# Patient Record
Sex: Male | Born: 1970 | Hispanic: No | Smoking: Never smoker
Health system: Southern US, Community
[De-identification: ages and names within clinical notes are randomized; demographics above are authoritative.]

## PROBLEM LIST (undated history)

## (undated) DIAGNOSIS — E119 Type 2 diabetes mellitus without complications: Secondary | ICD-10-CM

---

## 1997-09-20 ENCOUNTER — Encounter: Admission: RE | Admit: 1997-09-20 | Discharge: 1997-09-20 | Payer: Self-pay | Admitting: Internal Medicine

## 1998-05-24 ENCOUNTER — Encounter: Admission: RE | Admit: 1998-05-24 | Discharge: 1998-05-24 | Payer: Self-pay | Admitting: Hematology and Oncology

## 1998-06-28 ENCOUNTER — Encounter: Admission: RE | Admit: 1998-06-28 | Discharge: 1998-06-28 | Payer: Self-pay | Admitting: Internal Medicine

## 1999-01-31 ENCOUNTER — Other Ambulatory Visit: Admission: RE | Admit: 1999-01-31 | Discharge: 1999-01-31 | Payer: Self-pay | Admitting: Gastroenterology

## 1999-05-15 ENCOUNTER — Encounter: Payer: Self-pay | Admitting: Gastroenterology

## 1999-05-15 ENCOUNTER — Ambulatory Visit (HOSPITAL_COMMUNITY): Admission: RE | Admit: 1999-05-15 | Discharge: 1999-05-15 | Payer: Self-pay | Admitting: Gastroenterology

## 2000-03-19 ENCOUNTER — Ambulatory Visit (HOSPITAL_COMMUNITY): Admission: RE | Admit: 2000-03-19 | Discharge: 2000-03-19 | Payer: Self-pay | Admitting: Gastroenterology

## 2000-03-19 ENCOUNTER — Encounter: Payer: Self-pay | Admitting: Gastroenterology

## 2010-11-22 ENCOUNTER — Emergency Department (HOSPITAL_COMMUNITY)
Admission: EM | Admit: 2010-11-22 | Discharge: 2010-11-22 | Disposition: A | Discharge status: Home / Self Care | Payer: 59 | Attending: Emergency Medicine | Admitting: Emergency Medicine

## 2010-11-22 ENCOUNTER — Emergency Department (HOSPITAL_COMMUNITY): Payer: 59

## 2010-11-22 DIAGNOSIS — E789 Disorder of lipoprotein metabolism, unspecified: Secondary | ICD-10-CM | POA: Insufficient documentation

## 2010-11-22 DIAGNOSIS — I2510 Atherosclerotic heart disease of native coronary artery without angina pectoris: Secondary | ICD-10-CM | POA: Insufficient documentation

## 2010-11-22 DIAGNOSIS — I10 Essential (primary) hypertension: Secondary | ICD-10-CM | POA: Insufficient documentation

## 2010-11-22 DIAGNOSIS — M545 Low back pain, unspecified: Secondary | ICD-10-CM | POA: Insufficient documentation

## 2010-11-22 DIAGNOSIS — R109 Unspecified abdominal pain: Secondary | ICD-10-CM | POA: Insufficient documentation

## 2010-11-22 DIAGNOSIS — E119 Type 2 diabetes mellitus without complications: Secondary | ICD-10-CM | POA: Insufficient documentation

## 2010-11-22 DIAGNOSIS — R3915 Urgency of urination: Secondary | ICD-10-CM | POA: Insufficient documentation

## 2010-11-22 LAB — URINALYSIS, ROUTINE W REFLEX MICROSCOPIC
Bilirubin Urine: NEGATIVE
Nitrite: NEGATIVE
Specific Gravity, Urine: 1.027 (ref 1.005–1.030)
pH: 5.5 (ref 5.0–8.0)

## 2010-11-22 LAB — POCT I-STAT, CHEM 8
Glucose, Bld: 207 mg/dL — ABNORMAL HIGH (ref 70–99)
HCT: 47 % (ref 39.0–52.0)
Hemoglobin: 16 g/dL (ref 13.0–17.0)
Potassium: 3.7 mEq/L (ref 3.5–5.1)
Sodium: 141 mEq/L (ref 135–145)

## 2010-11-22 LAB — URINE MICROSCOPIC-ADD ON

## 2011-06-11 ENCOUNTER — Ambulatory Visit (INDEPENDENT_AMBULATORY_CARE_PROVIDER_SITE_OTHER): Payer: BC Managed Care – PPO

## 2011-06-11 DIAGNOSIS — J069 Acute upper respiratory infection, unspecified: Secondary | ICD-10-CM

## 2015-03-03 ENCOUNTER — Emergency Department (HOSPITAL_COMMUNITY): Payer: BLUE CROSS/BLUE SHIELD

## 2015-03-03 ENCOUNTER — Encounter (HOSPITAL_COMMUNITY)
Admission: EM | Disposition: A | Discharge status: Home / Self Care | Payer: Self-pay | Source: Home / Self Care | Attending: Cardiology

## 2015-03-03 ENCOUNTER — Inpatient Hospital Stay (HOSPITAL_COMMUNITY)
Admission: EM | Admit: 2015-03-03 | Discharge: 2015-03-05 | DRG: 247 | Disposition: A | Discharge status: Home / Self Care | Payer: BLUE CROSS/BLUE SHIELD | Attending: Cardiology | Admitting: Cardiology

## 2015-03-03 ENCOUNTER — Encounter (HOSPITAL_COMMUNITY): Payer: Self-pay | Admitting: *Deleted

## 2015-03-03 DIAGNOSIS — I2109 ST elevation (STEMI) myocardial infarction involving other coronary artery of anterior wall: Secondary | ICD-10-CM | POA: Diagnosis not present

## 2015-03-03 DIAGNOSIS — R079 Chest pain, unspecified: Secondary | ICD-10-CM | POA: Diagnosis not present

## 2015-03-03 DIAGNOSIS — Z955 Presence of coronary angioplasty implant and graft: Secondary | ICD-10-CM

## 2015-03-03 DIAGNOSIS — I2510 Atherosclerotic heart disease of native coronary artery without angina pectoris: Secondary | ICD-10-CM | POA: Diagnosis present

## 2015-03-03 DIAGNOSIS — E1165 Type 2 diabetes mellitus with hyperglycemia: Secondary | ICD-10-CM | POA: Diagnosis present

## 2015-03-03 DIAGNOSIS — IMO0002 Reserved for concepts with insufficient information to code with codable children: Secondary | ICD-10-CM | POA: Diagnosis present

## 2015-03-03 DIAGNOSIS — I213 ST elevation (STEMI) myocardial infarction of unspecified site: Secondary | ICD-10-CM

## 2015-03-03 DIAGNOSIS — E785 Hyperlipidemia, unspecified: Secondary | ICD-10-CM | POA: Diagnosis present

## 2015-03-03 HISTORY — PX: CARDIAC CATHETERIZATION: SHX172

## 2015-03-03 HISTORY — DX: Type 2 diabetes mellitus without complications: E11.9

## 2015-03-03 LAB — APTT: APTT: 29 s (ref 24–37)

## 2015-03-03 LAB — BASIC METABOLIC PANEL
Anion gap: 11 (ref 5–15)
BUN: 19 mg/dL (ref 6–20)
CALCIUM: 9.2 mg/dL (ref 8.9–10.3)
CO2: 28 mmol/L (ref 22–32)
Chloride: 95 mmol/L — ABNORMAL LOW (ref 101–111)
Creatinine, Ser: 1.17 mg/dL (ref 0.61–1.24)
GFR calc Af Amer: >60 mL/min (ref >60)
Glucose, Bld: 339 mg/dL — ABNORMAL HIGH (ref 65–99)
POTASSIUM: 3.7 mmol/L (ref 3.5–5.1)
SODIUM: 134 mmol/L — AB (ref 135–145)

## 2015-03-03 LAB — I-STAT TROPONIN, ED: TROPONIN I, POC: 0.01 ng/mL (ref 0.00–0.08)

## 2015-03-03 LAB — CBC
HEMATOCRIT: 46.4 % (ref 39.0–52.0)
Hemoglobin: 15.7 g/dL (ref 13.0–17.0)
MCH: 26.9 pg (ref 26.0–34.0)
MCHC: 33.8 g/dL (ref 30.0–36.0)
MCV: 79.5 fL (ref 78.0–100.0)
PLATELETS: 216 10*3/uL (ref 150–400)
RBC: 5.84 MIL/uL — ABNORMAL HIGH (ref 4.22–5.81)
RDW: 13 % (ref 11.5–15.5)
WBC: 11.4 10*3/uL — AB (ref 4.0–10.5)

## 2015-03-03 SURGERY — LEFT HEART CATH AND CORONARY ANGIOGRAPHY
Anesthesia: LOCAL

## 2015-03-03 MED ORDER — NITROGLYCERIN 1 MG/10 ML FOR IR/CATH LAB
INTRA_ARTERIAL | Status: AC
  Filled 2015-03-03: qty 10

## 2015-03-03 MED ORDER — SODIUM CHLORIDE 0.9 % IV SOLN
INTRAVENOUS | Status: DC
  Administered 2015-03-03: 23:00:00 via INTRAVENOUS

## 2015-03-03 MED ORDER — HEPARIN (PORCINE) IN NACL 2-0.9 UNIT/ML-% IJ SOLN
INTRAMUSCULAR | Status: AC
  Filled 2015-03-03: qty 1000

## 2015-03-03 MED ORDER — LIDOCAINE HCL (PF) 1 % IJ SOLN
INTRAMUSCULAR | Status: AC
  Filled 2015-03-03: qty 30

## 2015-03-03 MED ORDER — BIVALIRUDIN BOLUS VIA INFUSION - CUPID
INTRAVENOUS | Status: DC | PRN
  Administered 2015-03-03: 48.3 mg via INTRAVENOUS

## 2015-03-03 MED ORDER — SODIUM CHLORIDE 0.9 % IV SOLN
250.0000 mg | INTRAVENOUS | Status: DC | PRN
  Administered 2015-03-03: 1.75 mg/kg/h via INTRAVENOUS

## 2015-03-03 MED ORDER — BIVALIRUDIN 250 MG IV SOLR
INTRAVENOUS | Status: AC
  Filled 2015-03-03: qty 250

## 2015-03-03 MED ORDER — TICAGRELOR 90 MG PO TABS
ORAL_TABLET | ORAL | Status: AC
  Filled 2015-03-03: qty 1

## 2015-03-03 MED ORDER — HEPARIN SODIUM (PORCINE) 5000 UNIT/ML IJ SOLN
4000.0000 [IU] | Freq: Once | INTRAMUSCULAR | Status: AC
  Administered 2015-03-03: 4000 [IU] via INTRAVENOUS

## 2015-03-03 MED ORDER — NITROGLYCERIN 0.4 MG SL SUBL: 0.4000 mg | SUBLINGUAL_TABLET | SUBLINGUAL | Status: DC | PRN

## 2015-03-03 MED ORDER — NITROGLYCERIN 1 MG/10 ML FOR IR/CATH LAB
INTRA_ARTERIAL | Status: DC | PRN
  Administered 2015-03-03

## 2015-03-03 MED ORDER — ASPIRIN 81 MG PO CHEW
324.0000 mg | CHEWABLE_TABLET | Freq: Once | ORAL | Status: AC
  Administered 2015-03-03: 324 mg via ORAL
  Filled 2015-03-03: qty 4

## 2015-03-03 MED ORDER — HEPARIN (PORCINE) IN NACL 100-0.45 UNIT/ML-% IJ SOLN
900.0000 [IU]/h | INTRAMUSCULAR | Status: DC
  Administered 2015-03-03: 900 [IU]/h via INTRAVENOUS
  Filled 2015-03-03: qty 250

## 2015-03-03 MED ORDER — HEPARIN SODIUM (PORCINE) 1000 UNIT/ML IJ SOLN
INTRAMUSCULAR | Status: DC | PRN
  Administered 2015-03-03: 3000 [IU] via INTRAVENOUS

## 2015-03-03 MED ORDER — HEPARIN SODIUM (PORCINE) 1000 UNIT/ML IJ SOLN
INTRAMUSCULAR | Status: AC
  Filled 2015-03-03: qty 1

## 2015-03-03 MED ORDER — TICAGRELOR 90 MG PO TABS
ORAL_TABLET | ORAL | Status: DC | PRN
  Administered 2015-03-03: 180 mg via ORAL

## 2015-03-03 MED ORDER — VERAPAMIL HCL 2.5 MG/ML IV SOLN
INTRAVENOUS | Status: AC
  Filled 2015-03-03: qty 2

## 2015-03-03 SURGICAL SUPPLY — 16 items
BALLN TREK RX 2.5X15 (BALLOONS) ×4
BALLOON TREK RX 2.5X15 (BALLOONS) IMPLANT
CATH HEARTRAIL 6F IL4.0 (CATHETERS) ×1 IMPLANT
CATH INFINITI 5FR ANG PIGTAIL (CATHETERS) ×1 IMPLANT
CATH OPTITORQUE TIG 4.0 5F (CATHETERS) ×2 IMPLANT
DEVICE RAD COMP TR BAND LRG (VASCULAR PRODUCTS) ×2 IMPLANT
ELECT DEFIB PAD ADLT CADENCE (PAD) ×1 IMPLANT
GLIDESHEATH SLEND A-KIT 6F 20G (SHEATH) ×2 IMPLANT
KIT ENCORE 26 ADVANTAGE (KITS) ×1 IMPLANT
KIT HEART LEFT (KITS) ×2 IMPLANT
PACK CARDIAC CATHETERIZATION (CUSTOM PROCEDURE TRAY) ×2 IMPLANT
STENT RESOLUTE INTEG 2.5X22 (Permanent Stent) ×1 IMPLANT
TRANSDUCER W/STOPCOCK (MISCELLANEOUS) ×3 IMPLANT
TUBING CIL FLEX 10 FLL-RA (TUBING) ×2 IMPLANT
WIRE COUGAR XT STRL 190CM (WIRE) ×1 IMPLANT
WIRE SAFE-T 1.5MM-J .035X260CM (WIRE) ×2 IMPLANT

## 2015-03-03 NOTE — H&P (Addendum)
Russell Mcneil is an 44 y.o. male.   Chief Complaint: Chest pain HPI: Russell Mcneil  is a 44 y.o. male  With DM who states he  Has not seen a MD in more than a year presenting with chest pain on and off for the past 4-6 hours, pain started around 4:00 PM with radiation to the left arm, associated with feeling warmth throughout the body. However pain subsided spontaneously, around 10:00 this evening had severe pain worse than previous again associated with radiation to the left arm and feeling warm but no other symptoms. Denied any nausea, vomiting, diaphoresis, dizziness or syncope.  History reviewed.  Has diabetes mellitus, not on therapy.  History reviewed. No pertinent past surgical history.  Family history there is no history of premature coronary artery disease.  Social History:  reports that he has never smoked. He does not have any smokeless tobacco history on file. He reports that he does not drink alcohol or use illicit drugs.  Allergies: No Known Allergies  Review of Systems - Negative except Chest pain and diabetes mellitus. No symptoms to suggest TIA, claudication, neurological circumflex, weight changes.  Blood pressure 156/104, pulse 78, temperature 97.2 F (36.2 C), temperature source Oral, resp. rate 25, height 5' 3" (1.6 m), weight 64.411 kg (142 lb), SpO2 99 %. General appearance: alert, cooperative, appears stated age and no distress Eyes: negative findings: lids and lashes normal Neck: no adenopathy, no carotid bruit, no JVD, supple, symmetrical, trachea midline and thyroid not enlarged, symmetric, no tenderness/mass/nodules Neck: JVP - normal, carotids 2+= without bruits Resp: clear to auscultation bilaterally Chest wall: no tenderness Cardio: regular rate and rhythm, S1, S2 normal, no murmur, click, rub or gallop GI: soft, non-tender; bowel sounds normal; no masses,  no organomegaly Extremities: extremities normal, atraumatic, no cyanosis or edema Pulses: 2+ and  symmetric Skin: Skin color, texture, turgor normal. No rashes or lesions or Skin is cool and mildly damp. Neurologic: Grossly normal  Results for orders placed or performed during the hospital encounter of 03/03/15 (from the past 48 hour(s))  Basic metabolic panel     Status: Abnormal   Collection Time: 03/03/15 10:35 PM  Result Value Ref Range   Sodium 134 (L) 135 - 145 mmol/L   Potassium 3.7 3.5 - 5.1 mmol/L   Chloride 95 (L) 101 - 111 mmol/L   CO2 28 22 - 32 mmol/L   Glucose, Bld 339 (H) 65 - 99 mg/dL   BUN 19 6 - 20 mg/dL   Creatinine, Ser 1.17 0.61 - 1.24 mg/dL   Calcium 9.2 8.9 - 10.3 mg/dL   GFR calc non Af Amer >60 >60 mL/min   GFR calc Af Amer >60 >60 mL/min    Comment: (NOTE) The eGFR has been calculated using the CKD EPI equation. This calculation has not been validated in all clinical situations. eGFR's persistently <60 mL/min signify possible Chronic Kidney Disease.    Anion gap 11 5 - 15  CBC     Status: Abnormal   Collection Time: 03/03/15 10:35 PM  Result Value Ref Range   WBC 11.4 (H) 4.0 - 10.5 K/uL   RBC 5.84 (H) 4.22 - 5.81 MIL/uL   Hemoglobin 15.7 13.0 - 17.0 g/dL   HCT 46.4 39.0 - 52.0 %   MCV 79.5 78.0 - 100.0 fL   MCH 26.9 26.0 - 34.0 pg   MCHC 33.8 30.0 - 36.0 g/dL   RDW 13.0 11.5 - 15.5 %   Platelets 216 150 - 400  K/uL  I-stat troponin, ED     Status: None   Collection Time: 03/03/15 10:41 PM  Result Value Ref Range   Troponin i, poc 0.01 0.00 - 0.08 ng/mL   Comment 3            Comment: Due to the release kinetics of cTnI, a negative result within the first hours of the onset of symptoms does not rule out myocardial infarction with certainty. If myocardial infarction is still suspected, repeat the test at appropriate intervals.    No results found.  Labs:   Lab Results  Component Value Date   WBC 11.4* 03/03/2015   HGB 15.7 03/03/2015   HCT 46.4 03/03/2015   MCV 79.5 03/03/2015   PLT 216 03/03/2015    Recent Labs Lab  03/03/15 2235  NA 134*  K 3.7  CL 95*  CO2 28  BUN 19  CREATININE 1.17  CALCIUM 9.2  GLUCOSE 339*    Lipid Panel  No results found for: CHOL, TRIG, HDL, CHOLHDL, VLDL, LDLCALC  BNP (last 3 results) No results for input(s): BNP in the last 8760 hours.  ProBNP (last 3 results) No results for input(s): PROBNP in the last 8760 hours.  HEMOGLOBIN A1C No results found for: HGBA1C, MPG  Cardiac Panel (last 3 results) No results for input(s): CKTOTAL, CKMB, TROPONINI, RELINDX in the last 8760 hours.  No results found for: CKTOTAL, CKMB, CKMBINDEX, TROPONINI   TSH No results for input(s): TSH in the last 8760 hours.  EKG:03/04/2015 at 10:28 pm: Initial presentation EKG essentially reveals normal sinus rhythm with early repolarization with nonspecific inferior ST abnormality. Does not meet criteria for ST elevation myocardial infarction.  Repeat EKG 22: 45 hour reveals ST segment elevation in lead 1, aVL and V2, slight prominent ST elevation in V5 and V6, suggestive of acute injury pattern.  Current facility-administered medications:  .  0.9 %  sodium chloride infusion, , Intravenous, Continuous, Joshua Zavitz, MD, Last Rate: 20 mL/hr at 03/03/15 2302 .  heparin ADULT infusion 100 units/mL (25000 units/250 mL), 900 Units/hr, Intravenous, Continuous, Corey M Ball, RPH, Last Rate: 9 mL/hr at 03/03/15 2314, 900 Units/hr at 03/03/15 2314 .  nitroGLYCERIN (NITROSTAT) SL tablet 0.4 mg, 0.4 mg, Sublingual, Q5 min PRN, Joshua Zavitz, MD No current outpatient prescriptions on file.  Assessment/Plan 1. Acute coronary syndrome, ST elevation myocardial infarction, suspect LAD territory ischemia. 2. Diabetes mellitus type 2 uncontrolled, patient not on therapy.  Recommendation: Patient will be emergently taken to coronary angiography suite to evaluate coronary anatomy. Patient has ongoing chest discomfort. Patient's present present at the bedside I discussed with them also.  GANJI, JAY,  MD 03/03/2015, 11:16 PM [facility] [place] Pager: [phone] Office: [phone] If no answer: Cell:  [phone]     

## 2015-03-03 NOTE — ED Provider Notes (Addendum)
CSN: [id number]     Arrival date & time 03/03/15  2216 History   First MD Initiated Contact with Patient 03/03/15 2235     Chief Complaint  Patient presents with  . Code STEMI     (Consider location/radiation/quality/duration/timing/severity/associated sxs/prior Treatment) HPI Comments: 44 year old male with history of diabetes, nonsmoker, no known heart history or blood clot history presents with anterior chest pain radiating to left arm. Starting around 5 PM this evening, intermittent. Currently pain 5 out of 10. No history of similar. No diaphoresis or vomiting with it. No recent exertional symptoms. Mild difficulty with language barrier.  Patient is a 44 y.o. male presenting with chest pain. The history is provided by the patient.  Chest Pain Associated symptoms: no abdominal pain, no back pain, no fever, no headache, no shortness of breath and not vomiting     History reviewed. No pertinent past medical history. History reviewed. No pertinent past surgical history. No family history on file. Social History  Substance Use Topics  . Smoking status: Never Smoker   . Smokeless tobacco: None  . Alcohol Use: No    Review of Systems  Constitutional: Negative for fever and chills.  HENT: Negative for congestion.   Eyes: Negative for visual disturbance.  Respiratory: Negative for shortness of breath.   Cardiovascular: Positive for chest pain. Negative for leg swelling.  Gastrointestinal: Negative for vomiting and abdominal pain.  Genitourinary: Negative for dysuria and flank pain.  Musculoskeletal: Negative for back pain, neck pain and neck stiffness.  Skin: Negative for rash.  Neurological: Negative for light-headedness and headaches.      Allergies  Review of patient's allergies indicates no known allergies.  Home Medications   Prior to Admission medications   Not on File   BP 156/104 mmHg  Pulse 78  Temp(Src) 97.2 F (36.2 C) (Oral)  Resp 25  Ht  (1.6 m)   Wt 142 lb (64.411 kg)  BMI 25.16 kg/m2  SpO2 99% Physical Exam  Constitutional: He is oriented to person, place, and time. He appears well-developed and well-nourished.  HENT:  Head: Normocephalic and atraumatic.  Eyes: Conjunctivae are normal. Right eye exhibits no discharge. Left eye exhibits no discharge.  Neck: Normal range of motion. Neck supple. No tracheal deviation present.  Cardiovascular: Normal rate, regular rhythm and intact distal pulses.   Pulmonary/Chest: Effort normal and breath sounds normal.  Abdominal: Soft. He exhibits no distension. There is no tenderness. There is no guarding.  Musculoskeletal: He exhibits no edema.  Neurological: He is alert and oriented to person, place, and time.  Skin: Skin is warm. No rash noted.  Psychiatric: He has a normal mood and affect.  Nursing note and vitals reviewed.   ED Course  Procedures (including critical care time) CRITICAL CARE Performed by: Enid Skeens   Total critical care time: 35 min  Critical care time was exclusive of separately billable procedures and treating other patients.  Critical care was necessary to treat or prevent imminent or life-threatening deterioration.  Critical care was time spent personally by me on the following activities: development of treatment plan with patient and/or surrogate as well as nursing, discussions with consultants, evaluation of patient's response to treatment, examination of patient, obtaining history from patient or surrogate, ordering and performing treatments and interventions, ordering and review of laboratory studies, ordering and review of radiographic studies, pulse oximetry and re-evaluation of patient's condition.    EMERGENCY DEPARTMENT [place] CARDIAC EXAM "Study: Limited Ultrasound of the heart and  pericardium"  INDICATIONS:cp Multiple views of the heart and pericardium were obtained in real-time with a multi-frequency probe.  PERFORMED ZO:XWRUEA  IMAGES  ARCHIVED?: Yes  FINDINGS: No pericardial effusion, Normal contractility and Tamponade physiology absent  LIMITATIONS:  Emergent procedure  VIEWS USED: Parasternal long axis, Parasternal short axis and Apical 4 chamber   INTERPRETATION: Cardiac activity present, Pericardial effusioin absent, Cardiac tamponade absent and Normal contractility  CPT Code: [phone] (limited transthoracic cardiac) All that [place] told Labs Review Labs Reviewed  BASIC METABOLIC PANEL - Abnormal; Notable for the following:    Sodium 134 (*)    Chloride 95 (*)    Glucose, Bld 339 (*)    All other components within normal limits  CBC - Abnormal; Notable for the following:    WBC 11.4 (*)    RBC 5.84 (*)    All other components within normal limits  APTT  HEPARIN LEVEL (UNFRACTIONATED)  CBC  I-STAT TROPOININ, ED    Imaging Review No results found. I have personally reviewed and evaluated these images and lab results as part of my medical decision-making.   EKG Interpretation   Date/Time:  Sunday March 03 2015 22:45:09 EDT Ventricular Rate:  75 PR Interval:  159 QRS Duration: 98 QT Interval:  379 QTC Calculation: 423 R Axis:   30 Text Interpretation:  Sinus rhythm Lateral infarct, acute Confirmed by  ZAVITZ  MD, JOSHUA ([id number]) on 03/03/2015 11:07:04 PM     Initial EKG heart rate 71 sinus rhythm, normal QT, very mild ST elevation lateral without reciprocal changes.\\  Second EKG normal sinus rhythm, heart rate 68, very mild ST elevation laterally, normal QT. MDM   Final diagnoses:  Acute chest pain  Acute ST elevation myocardial infarction (STEMI), unspecified artery   Patient presented with chest pain. In triage patient EKG done with very subtle ST changes. Patient was brought back to resuscitation area and repeat EKG no significant changes. Patient moderate chest pain. Discussed with cardiologist interventionist on-call and we reviewed the EKGs and plan to follow clinically and await  for first troponin. Third EKG performed and revealed acute changes concerning for ST elevation heart attack. Repeat age cardiology and code STEMI called. Aspirin heparin given. Plan for cath lab.  The patients results and plan were reviewed and discussed.   Any x-rays performed were independently reviewed by myself.   Differential diagnosis were considered with the presenting HPI.  Medications  0.9 %  sodium chloride infusion ( Intravenous New Bag/Given 03/03/15 2302)  nitroGLYCERIN (NITROSTAT) SL tablet 0.4 mg (not administered)  heparin ADULT infusion 100 units/mL (25000 units/250 mL) (900 Units/hr Intravenous New Bag/Given 03/03/15 2314)  aspirin chewable tablet 324 mg (324 mg Oral Given 03/03/15 2248)  heparin injection 4,000 Units (4,000 Units Intravenous Given 03/03/15 2303)    Filed Vitals:   03/03/15 2224 03/03/15 2238 03/03/15 2310  BP: 168/107 166/105 156/104  Pulse: 69 67 78  Temp: 97.2 F (36.2 C)    TempSrc: Oral    Resp: Height:  (1.6 m)    Weight: 142 lb (64.411 kg)    SpO2: 99%      Final diagnoses:  Acute chest pain  Acute ST elevation myocardial infarction (STEMI), unspecified artery    Admission/ observation were discussed with the admitting physician, patient and/or family and they are comfortable with the plan.     Blane Ohara, MD 03/03/15 2316  Blane Ohara, MD 03/03/15 [phone]

## 2015-03-03 NOTE — Progress Notes (Signed)
ANTICOAGULATION CONSULT NOTE - Initial Consult  Pharmacy Consult for Heparin Indication: chest pain/ACS  No Known Allergies  Patient Measurements: Height:  (160 cm) Weight: 142 lb (64.411 kg) IBW/kg (Calculated) : 56.9 Heparin Dosing Weight: 64 kg  Vital Signs: Temp: 97.2 F (36.2 C) (09/18 2224) Temp Source: Oral (09/18 2224) BP: 166/105 mmHg (09/18 2238) Pulse Rate: 67 (09/18 2238)  Labs:  Recent Labs  03/03/15 2235  HGB 15.7  HCT 46.4  PLT 216    CrCl cannot be calculated (Patient has no serum creatinine result on file.).   Medical History: History reviewed. No pertinent past medical history.  Medications:   (Not in a hospital admission) Scheduled:  . heparin  4,000 Units Intravenous Once   Infusions:  . sodium chloride 20 mL/hr at 03/03/15 2302    Assessment: 44yo male with history of DM2 presents with anterior CP radiating to L arm. Pharmacy is consulted to dose heparin for ACS/chest pain. CBC wnl, sCr 1.1, trop neg x1.  Pt received heparin 4000 units bolus in the ED.  Goal of Therapy:  Heparin level 0.3-0.7 units/ml Monitor platelets by anticoagulation protocol: Yes   Plan:  Give 4000 units bolus x 1 Start heparin infusion at 900 units/hr Check anti-Xa level in 6 hours and daily while on heparin Continue to monitor H&H and platelets  Arlean Hopping. Newman Pies, PharmD Clinical Pharmacist Pager [phone] 03/03/2015,11:05 PM

## 2015-03-03 NOTE — ED Notes (Signed)
Pt placed on the defibrillator

## 2015-03-03 NOTE — Progress Notes (Signed)
Chaplain responded to Code Stemi.  Upon arrival to the ED the Chaplain was informed that the patient had been taken to the Cath Lab, there was no direct contact at this time with the patient.  The family was present and Chaplain presented to them to offer emotional support, encouragement, and comfort measures. They were escorted to the the Cath Lab waiting area and the Cath Lab were informed.  Chaplain will continue to follow as needed for support. Chaplain Janell Quiet [phone]

## 2015-03-03 NOTE — ED Notes (Signed)
Pt c/o chest pain to right and left chest with radiation to left arm.

## 2015-03-03 NOTE — ED Notes (Signed)
On the way to cath lab now with this RN

## 2015-03-04 ENCOUNTER — Encounter (HOSPITAL_COMMUNITY): Payer: Self-pay | Admitting: *Deleted

## 2015-03-04 ENCOUNTER — Inpatient Hospital Stay (HOSPITAL_COMMUNITY): Payer: BLUE CROSS/BLUE SHIELD

## 2015-03-04 DIAGNOSIS — E785 Hyperlipidemia, unspecified: Secondary | ICD-10-CM | POA: Diagnosis present

## 2015-03-04 DIAGNOSIS — R079 Chest pain, unspecified: Secondary | ICD-10-CM | POA: Diagnosis present

## 2015-03-04 DIAGNOSIS — I2510 Atherosclerotic heart disease of native coronary artery without angina pectoris: Secondary | ICD-10-CM | POA: Diagnosis present

## 2015-03-04 DIAGNOSIS — I2109 ST elevation (STEMI) myocardial infarction involving other coronary artery of anterior wall: Secondary | ICD-10-CM | POA: Diagnosis present

## 2015-03-04 DIAGNOSIS — E1165 Type 2 diabetes mellitus with hyperglycemia: Secondary | ICD-10-CM | POA: Diagnosis present

## 2015-03-04 LAB — LIPID PANEL
CHOLESTEROL: 214 mg/dL — AB (ref 0–200)
Cholesterol: 240 mg/dL — ABNORMAL HIGH (ref 0–200)
HDL: 39 mg/dL — ABNORMAL LOW (ref >40)
HDL: 49 mg/dL (ref >40)
LDL CALC: 182 mg/dL — AB (ref 0–99)
LDL Cholesterol: 161 mg/dL — ABNORMAL HIGH (ref 0–99)
TRIGLYCERIDES: 44 mg/dL (ref <150)
TRIGLYCERIDES: 69 mg/dL (ref <150)
Total CHOL/HDL Ratio: 4.9 RATIO
Total CHOL/HDL Ratio: 5.5 RATIO
VLDL: 14 mg/dL (ref 0–40)
VLDL: 9 mg/dL (ref 0–40)

## 2015-03-04 LAB — BASIC METABOLIC PANEL
ANION GAP: 9 (ref 5–15)
BUN: 18 mg/dL (ref 6–20)
CHLORIDE: 100 mmol/L — AB (ref 101–111)
CO2: 26 mmol/L (ref 22–32)
Calcium: 9.2 mg/dL (ref 8.9–10.3)
Creatinine, Ser: 1.14 mg/dL (ref 0.61–1.24)
GFR calc Af Amer: >60 mL/min (ref >60)
GLUCOSE: 394 mg/dL — AB (ref 65–99)
POTASSIUM: 3.7 mmol/L (ref 3.5–5.1)
Sodium: 135 mmol/L (ref 135–145)

## 2015-03-04 LAB — GLUCOSE, CAPILLARY
GLUCOSE-CAPILLARY: 307 mg/dL — AB (ref 65–99)
GLUCOSE-CAPILLARY: 210 mg/dL — AB (ref 65–99)
GLUCOSE-CAPILLARY: 209 mg/dL — AB (ref 65–99)
Glucose-Capillary: 329 mg/dL — ABNORMAL HIGH (ref 65–99)
Glucose-Capillary: 155 mg/dL — ABNORMAL HIGH (ref 65–99)
Glucose-Capillary: 111 mg/dL — ABNORMAL HIGH (ref 65–99)

## 2015-03-04 LAB — TSH: TSH: 0.719 u[IU]/mL (ref 0.350–4.500)

## 2015-03-04 LAB — CBC
HEMATOCRIT: 44 % (ref 39.0–52.0)
Hemoglobin: 15 g/dL (ref 13.0–17.0)
MCH: 26.9 pg (ref 26.0–34.0)
MCHC: 34.1 g/dL (ref 30.0–36.0)
MCV: 78.9 fL (ref 78.0–100.0)
Platelets: 190 10*3/uL (ref 150–400)
RBC: 5.58 MIL/uL (ref 4.22–5.81)
RDW: 12.9 % (ref 11.5–15.5)
WBC: 12.8 10*3/uL — ABNORMAL HIGH (ref 4.0–10.5)

## 2015-03-04 LAB — TROPONIN I
TROPONIN I: 5.27 ng/mL — AB (ref <0.031)
TROPONIN I: 35.88 ng/mL — AB (ref <0.031)
Troponin I: 60.03 ng/mL (ref <0.031)

## 2015-03-04 LAB — MRSA PCR SCREENING: MRSA by PCR: NEGATIVE

## 2015-03-04 LAB — POCT ACTIVATED CLOTTING TIME: Activated Clotting Time: >1000 seconds

## 2015-03-04 MED ORDER — SODIUM CHLORIDE 0.9 % IV SOLN: 250.0000 mL | INTRAVENOUS | Status: DC | PRN

## 2015-03-04 MED ORDER — INSULIN ASPART 100 UNIT/ML ~~LOC~~ SOLN
10.0000 [IU] | Freq: Once | SUBCUTANEOUS | Status: AC
  Administered 2015-03-04: 10 [IU] via SUBCUTANEOUS

## 2015-03-04 MED ORDER — SODIUM CHLORIDE 0.9 % WEIGHT BASED INFUSION
3.0000 mL/kg/h | INTRAVENOUS | Status: AC
  Administered 2015-03-04: 3 mL/kg/h via INTRAVENOUS

## 2015-03-04 MED ORDER — SODIUM CHLORIDE 0.9 % IJ SOLN
3.0000 mL | Freq: Two times a day (BID) | INTRAMUSCULAR | Status: DC
  Administered 2015-03-04 – 2015-03-05 (×3): 3 mL via INTRAVENOUS

## 2015-03-04 MED ORDER — ASPIRIN 300 MG RE SUPP: 300.0000 mg | RECTAL | Status: DC

## 2015-03-04 MED ORDER — IOHEXOL 350 MG/ML SOLN
INTRAVENOUS | Status: DC | PRN
  Administered 2015-03-04: 85 mL via INTRA_ARTERIAL

## 2015-03-04 MED ORDER — ASPIRIN 81 MG PO CHEW
81.0000 mg | CHEWABLE_TABLET | Freq: Every day | ORAL | Status: DC
  Administered 2015-03-04 – 2015-03-05 (×2): 81 mg via ORAL
  Filled 2015-03-04 (×2): qty 1

## 2015-03-04 MED ORDER — INSULIN ASPART 100 UNIT/ML ~~LOC~~ SOLN
8.0000 [IU] | Freq: Once | SUBCUTANEOUS | Status: AC
Start: 2015-03-04 — End: 2015-03-04
  Administered 2015-03-04: 8 [IU] via SUBCUTANEOUS

## 2015-03-04 MED ORDER — PNEUMOCOCCAL VAC POLYVALENT 25 MCG/0.5ML IJ INJ
0.5000 mL | INJECTION | INTRAMUSCULAR | Status: AC
  Administered 2015-03-05: 0.5 mL via INTRAMUSCULAR
  Filled 2015-03-04: qty 0.5

## 2015-03-04 MED ORDER — TICAGRELOR 90 MG PO TABS
90.0000 mg | ORAL_TABLET | Freq: Two times a day (BID) | ORAL | Status: DC
  Administered 2015-03-04 – 2015-03-05 (×3): 90 mg via ORAL
  Filled 2015-03-04 (×3): qty 1

## 2015-03-04 MED ORDER — ZOLPIDEM TARTRATE 5 MG PO TABS: 5.0000 mg | ORAL_TABLET | Freq: Every evening | ORAL | Status: DC | PRN

## 2015-03-04 MED ORDER — CARVEDILOL 6.25 MG PO TABS
6.2500 mg | ORAL_TABLET | Freq: Two times a day (BID) | ORAL | Status: DC
  Administered 2015-03-04 – 2015-03-05 (×3): 6.25 mg via ORAL
  Filled 2015-03-04 (×3): qty 1

## 2015-03-04 MED ORDER — ALPRAZOLAM 0.25 MG PO TABS: 0.2500 mg | ORAL_TABLET | Freq: Two times a day (BID) | ORAL | Status: DC | PRN

## 2015-03-04 MED ORDER — INSULIN ASPART 100 UNIT/ML ~~LOC~~ SOLN
8.0000 [IU] | Freq: Once | SUBCUTANEOUS | Status: AC
  Administered 2015-03-04: 8 [IU] via SUBCUTANEOUS

## 2015-03-04 MED ORDER — INSULIN ASPART 100 UNIT/ML ~~LOC~~ SOLN
3.0000 [IU] | Freq: Three times a day (TID) | SUBCUTANEOUS | Status: DC
  Administered 2015-03-04 – 2015-03-05 (×5): 3 [IU] via SUBCUTANEOUS

## 2015-03-04 MED ORDER — GLYBURIDE 5 MG PO TABS
5.0000 mg | ORAL_TABLET | Freq: Every day | ORAL | Status: DC
  Administered 2015-03-04 – 2015-03-05 (×2): 5 mg via ORAL
  Filled 2015-03-04 (×3): qty 1

## 2015-03-04 MED ORDER — INSULIN ASPART 100 UNIT/ML ~~LOC~~ SOLN
0.0000 [IU] | Freq: Three times a day (TID) | SUBCUTANEOUS | Status: DC
Start: 2015-03-04 — End: 2015-03-05
  Administered 2015-03-04: 11 [IU] via SUBCUTANEOUS
  Administered 2015-03-04: 5 [IU] via SUBCUTANEOUS
  Administered 2015-03-05: 8 [IU] via SUBCUTANEOUS
  Administered 2015-03-05: 3 [IU] via SUBCUTANEOUS

## 2015-03-04 MED ORDER — ASPIRIN EC 81 MG PO TBEC: 81.0000 mg | DELAYED_RELEASE_TABLET | Freq: Every day | ORAL | Status: DC

## 2015-03-04 MED ORDER — NITROGLYCERIN 0.4 MG SL SUBL: 0.4000 mg | SUBLINGUAL_TABLET | SUBLINGUAL | Status: DC | PRN

## 2015-03-04 MED ORDER — ATORVASTATIN CALCIUM 80 MG PO TABS
80.0000 mg | ORAL_TABLET | Freq: Every day | ORAL | Status: DC
  Administered 2015-03-04: 80 mg via ORAL
  Filled 2015-03-04: qty 1

## 2015-03-04 MED ORDER — SODIUM CHLORIDE 0.9 % IJ SOLN: 3.0000 mL | INTRAMUSCULAR | Status: DC | PRN

## 2015-03-04 MED ORDER — LISINOPRIL 5 MG PO TABS
5.0000 mg | ORAL_TABLET | Freq: Every day | ORAL | Status: DC
  Administered 2015-03-04 – 2015-03-05 (×2): 5 mg via ORAL
  Filled 2015-03-04 (×2): qty 1

## 2015-03-04 MED ORDER — ACETAMINOPHEN 325 MG PO TABS: 650.0000 mg | ORAL_TABLET | ORAL | Status: DC | PRN

## 2015-03-04 MED ORDER — ASPIRIN 81 MG PO CHEW: 324.0000 mg | CHEWABLE_TABLET | ORAL | Status: DC

## 2015-03-04 MED ORDER — ONDANSETRON HCL 4 MG/2ML IJ SOLN: 4.0000 mg | Freq: Four times a day (QID) | INTRAMUSCULAR | Status: DC | PRN

## 2015-03-04 NOTE — Progress Notes (Signed)
CSW order received that states that patient needs a CBC machine and stopped taking his oral diabetes meds.  Will notify RNCM for follow up. CSW will sign off but will be available should any CSW needs be identified.  Lorri Frederick. Jaci Lazier, [place] [phone]

## 2015-03-04 NOTE — Care Management Note (Signed)
Case Management Note  Patient Details  Name: Russell Mcneil MRN: [id number] Date of Birth: 01-May-1971  Subjective/Objective:      Pt adm w mi              Action/Plan: lives w fam, no ins listed   Expected Discharge Date:                  Expected Discharge Plan:  Home/Self Care  In-House Referral:     Discharge planning Services  CM Consult, Indigent Health Clinic, Medication Assistance  Post Acute Care Choice:    Choice offered to:     DME Arranged:    DME Agency:     HH Arranged:    HH Agency:     Status of Service:     Medicare Important Message Given:    Date Medicare IM Given:    Medicare IM give by:    Date Additional Medicare IM Given:    Additional Medicare Important Message give by:     If discussed at Long Length of Stay Meetings, dates discussed:    Additional Comments: ur review done. Gave pt 30day free brilinta card. Gave pt inform on [place] and wellness clinic. Placed pt assist form for brilinta on shadow chart.  Hanley Hays, RN 03/04/2015, 9:40 AM

## 2015-03-04 NOTE — Progress Notes (Signed)
Subjective:  No further chest pain. Uncomfortable laying in bed.  Objective:  Vital Signs in the last 24 hours: Temp:  [97.2 F (36.2 C)-98.6 F (37 C)] 97.8 F (36.6 C) (09/19 1139) Pulse Rate:  [0-80] 68 (09/19 1000) Resp:  [0-25] 19 (09/19 1000) BP: (103-168)/(63-107) 124/82 mmHg (09/19 1000) SpO2:  [0 %-100 %] 98 % (09/19 1000) Weight:  [64.411 kg (142 lb)] 64.411 kg (142 lb) (09/18 2224)  Intake/Output from previous day: 09/18 0701 - 09/19 0700 In: 769.6 [I.V.:769.6] Out: 875 [Urine:875]  Physical Exam:   General appearance: alert, cooperative, appears stated age and no distress Eyes: negative findings: lids and lashes normal Neck: no adenopathy, no carotid bruit, no JVD, supple, symmetrical, trachea midline and thyroid not enlarged, symmetric, no tenderness/mass/nodules Neck: JVP - normal, carotids 2+= without bruits Resp: clear to auscultation bilaterally Chest wall: no tenderness Cardio: regular rate and rhythm, S1, S2 normal, no murmur, click, rub or gallop GI: soft, non-tender; bowel sounds normal; no masses,  no organomegaly Extremities: extremities normal, atraumatic, no cyanosis or edema    Lab Results: BMP  Recent Labs  03/03/15 2235 03/04/15 0101  NA 134* 135  K 3.7 3.7  CL 95* 100*  CO2 28 26  GLUCOSE 339* 394*  BUN 19 18  CREATININE 1.17 1.14  CALCIUM 9.2 9.2  GFRNONAA >60 >60  GFRAA >60 >60    CBC  Recent Labs Lab 03/04/15 0101  WBC 12.8*  RBC 5.58  HGB 15.0  HCT 44.0  PLT 190  MCV 78.9  MCH 26.9  MCHC 34.1  RDW 12.9    Cardiac Panel (last 3 results)  Recent Labs  03/04/15 0101 03/04/15 0850  TROPONINI 5.27* 60.03*    BNP (last 3 results) No results for input(s): PROBNP in the last 8760 hours.  TSH  Recent Labs  03/04/15 0101  TSH 0.719    CHOLESTEROL  Recent Labs  03/03/15 2345 03/04/15 0101  CHOL 214* 240*    Hepatic Function Panel No results for input(s): PROT, ALBUMIN, AST, ALT, ALKPHOS, BILITOT,  BILIDIR, IBILI in the last 8760 hours.  Imaging: Imaging results have been reviewed  Cardiac Studies:   EKG:03/04/2015 at 10:28 pm:  Initial presentation EKG essentially reveals normal sinus rhythm with early repolarization with nonspecific inferior ST abnormality. Does not meet criteria for ST elevation myocardial infarction.  Repeat EKG 22: 45 hour reveals ST segment elevation in lead 1, aVL and V2, slight prominent ST elevation in V5 and V6, suggestive of acute injury pattern.  EKG 03/04/2015 this morning reveals normal sinus rhythm with anterolateral infarct old.  Scheduled Meds: . aspirin  81 mg Oral Daily  . atorvastatin  80 mg Oral q1800  . carvedilol  6.25 mg Oral BID WC  . insulin aspart  0-15 Units Subcutaneous TID WC  . insulin aspart  3 Units Subcutaneous TID WC  . [START ON 03/05/2015] pneumococcal 23 valent vaccine  0.5 mL Intramuscular Tomorrow-1000  . sodium chloride  3 mL Intravenous Q12H  . ticagrelor  90 mg Oral BID   Continuous Infusions:  PRN Meds:.sodium chloride, acetaminophen, ALPRAZolam, nitroGLYCERIN, ondansetron (ZOFRAN) IV, sodium chloride, zolpidem   Assessment/Plan:  1.  Acute anterolateral myocardial infarction, successful angioplasty to the mid LAD with implantation of a 2.5 x 22 mm resolute integrity DES, diffusely diseased LAD and diagonal branches. 2.  Diabetes mellitus type 2 uncontrolled 3.  Hyperlipidemia  Recommendation: Patient is presently doing well, we'll have diabetes ordered any to evaluate the patient, preparing  for discharge tomorrow morning.  Transfer to telemetry, ambulated, cardiac rehabitation. And ACE inhibitor will be added.   Yates Decamp, M.D. 03/04/2015, 11:49 AM [facility] Pager: [phone] Office: [phone] If no answer: [phone]

## 2015-03-04 NOTE — Plan of Care (Signed)
Problem: Food- and Nutrition-Related Knowledge Deficit (NB-1.1) Goal: Nutrition education Formal process to instruct or train a patient/client in a skill or to impart knowledge to help patients/clients voluntarily manage or modify food choices and eating behavior to maintain or improve health. Outcome: Completed/Met Date Met:  03/04/15  RD consulted for nutrition education regarding diabetes.  Per record review pt has not taken any oral DM medications for some time. He has also not been taking his blood sugar.  Pt eats rice with most meals. We discussed this specifically and discussed portion sizes.   No results found for: HGBA1C   RD provided "Carbohydrate Counting for People with Diabetes" handout from the Academy of [employer] and Dietetics. Discussed different food groups and their effects on blood sugar, emphasizing carbohydrate-containing foods. Provided list of carbohydrates and recommended serving sizes of common foods.  Discussed importance of controlled and consistent carbohydrate intake throughout the day. Provided examples of ways to balance meals/snacks and encouraged intake of high-fiber, whole grain complex carbohydrates. Teach back method used.  Expect fair compliance.  Body mass index is 25.16 kg/(m^2). Pt meets criteria for overweight based on current BMI.  Current diet order is CHO Modified, patient is consuming approximately 100% of meals at this time. Labs and medications reviewed. No further nutrition interventions warranted at this time. RD contact information provided. If additional nutrition issues arise, please re-consult RD.  Cross, [place], [place] Pager [phone] After Hours Pager

## 2015-03-04 NOTE — Progress Notes (Signed)
Echocardiogram 2D Echocardiogram has been performed.  Russell Mcneil 03/04/2015, 12:00 PM

## 2015-03-04 NOTE — Progress Notes (Signed)
Spoke with patient and his family about diabetes and home regimen for diabetes control. Patient gave permission to discuss with family in the room. Patient reports that he was diagnosed with diabetes several years ago and he was taking 2 oral DM medications ( one pill twice a day and one pill once every morning). Patient describes one pill as "big and white" which may be Metformin. Patient reports that he has not seen a doctor in a long time. He states that he stopped taking the pills because they made him "uncomfortable" and he did not like taking them. Patient did not explain "uncomfortable" but when talking about the side effects of Metformin patient shook his head when diarrhea was discussed. Explained to the patient there are several other medications that can be used for diabetes if he could not tolerate Metformin. Informed patient that he is currently ordered Glipizide 5 mg QAM which should not cause any diarrhea.  Discussed glycemic control and A1C and explained that an A1C has been drawn and is currently in process. Reviewed glucose and A1C goals. Discussed basic pathophysiology of DM Type 2, basic home care, importance of checking CBGs and maintaining good CBG control to prevent long-term and short-term complications. Discussed impact of nutrition, exercise, stress, sickness, and medications on diabetes control. Discussed carbohydrates, carbohydrate goals per day and meal, along with portion sizes. Patient drinks mostly water but eats a lot of rice. Placed consult for RD for further dietary education. Explained how hyperglycemia damages inner lining of blood vessels and can lead to complications commonly seen with uncontrolled diabetes. Stressed importance of taking DM medications and keeping diabetes controlled. Inquired about glucose monitoring and patient reports that he has a glucometer but he states that he does not think it works because it use to give him error messages a lot when he did test his  glucose. Encouraged patient to get a new glucometer and to test his glucose 2-3 times per day and take his meter with him to follow up visits. Inquired about follow up and patient's wife states that they will see if he can go back to his prior doctor as they prefer to go there instead of the clinic. Patient verbalized understanding of information discussed and he states that he has no further questions at this time related to diabetes.   MD, at time of discharge please provide patient with a prescription for glucometer, test strips, lancets, and DM medication(s).  Thanks, Orlando Penner, RN, MSN, CCRN, CDE Diabetes Coordinator Inpatient Diabetes Program [phone] (Team Pager) [phone] ([place] office) [phone] [facility] office) [phone] [facility] office)

## 2015-03-04 NOTE — Progress Notes (Addendum)
CARDIAC REHAB PHASE I   PRE:  Rate/Rhythm: 79 SR  BP:  Sitting: 124/80        SaO2:   MODE:  Ambulation: 700 ft   POST:  Rate/Rhythm: 74 SR  BP:  Sitting: 116/79         SaO2: 100 RA  Pt up getting ready to ambulate with RN, pt agreeable to walk with cardiac rehab. Pt ambulated 700 ft on RA, handheld assist, steady gait, tolerated well.  Pt denies DOE, cp, dizziness, declined rest stop. Pt states his legs feel a little weak, and did note that last night he had some brief episodes of shortness of breath, possibly a side effect of his new brilinta. Began MI/stent education with pt wife at bedside.  Reviewed risk factors, MI book, anti-platelet therapy, medication compliance, stent card, activity restrictions, ntg, and phase 2 cardiac rehab. Gave pt heart healthy and diabetes diet handouts to review. Pt verbalized understanding, needs reinforcement. Pt agrees to phase 2 cardiac rehab referral, will send to [facility], however, pt states he does not think he will have time with his work schedule. Pt reports he stopped taking his diabetes medication and does not check his blood sugars. Pt may benefit from diabetes coordinator consult, depending on A1c results. Pt states every time he checked his blood sugar his machine read "error."  Pt seems to have little insight into his diabetes. Pt to recliner after walk, but moved to bed as echo at bedside, call bell within reach. Will follow-up tomorrow.  [phone]  Joylene Grapes, RN, BSN 03/04/2015 11:37 AM

## 2015-03-04 NOTE — Progress Notes (Signed)
Critical troponin level received of 5.2. Dr. Nadara Eaton made aware.

## 2015-03-05 LAB — GLUCOSE, CAPILLARY
GLUCOSE-CAPILLARY: 269 mg/dL — AB (ref 65–99)
GLUCOSE-CAPILLARY: 252 mg/dL — AB (ref 65–99)
Glucose-Capillary: 312 mg/dL — ABNORMAL HIGH (ref 65–99)

## 2015-03-05 LAB — HEMOGLOBIN A1C
HEMOGLOBIN A1C: 10.7 % — AB (ref 4.8–5.6)
MEAN PLASMA GLUCOSE: 260 mg/dL

## 2015-03-05 MED ORDER — METFORMIN HCL 500 MG PO TABS
500.0000 mg | ORAL_TABLET | Freq: Once | ORAL | Status: AC
  Administered 2015-03-05: 500 mg via ORAL
  Filled 2015-03-05: qty 1

## 2015-03-05 MED ORDER — TICAGRELOR 90 MG PO TABS: 90.0000 mg | ORAL_TABLET | Freq: Two times a day (BID) | ORAL | Status: AC

## 2015-03-05 MED ORDER — GLYBURIDE 5 MG PO TABS: 5.0000 mg | ORAL_TABLET | Freq: Every day | ORAL | Status: DC

## 2015-03-05 MED ORDER — CARVEDILOL 6.25 MG PO TABS: 6.2500 mg | ORAL_TABLET | Freq: Two times a day (BID) | ORAL | Status: AC

## 2015-03-05 MED ORDER — LISINOPRIL 5 MG PO TABS: 5.0000 mg | ORAL_TABLET | Freq: Every day | ORAL | Status: AC

## 2015-03-05 MED ORDER — NITROGLYCERIN 0.4 MG SL SUBL: 0.4000 mg | SUBLINGUAL_TABLET | SUBLINGUAL | Status: AC | PRN

## 2015-03-05 MED ORDER — ATORVASTATIN CALCIUM 80 MG PO TABS: 80.0000 mg | ORAL_TABLET | Freq: Every day | ORAL | Status: AC

## 2015-03-05 MED ORDER — ASPIRIN 81 MG PO CHEW: 81.0000 mg | CHEWABLE_TABLET | Freq: Every day | ORAL | Status: AC

## 2015-03-05 MED ORDER — METFORMIN HCL 500 MG PO TABS: 500.0000 mg | ORAL_TABLET | Freq: Two times a day (BID) | ORAL | Status: AC

## 2015-03-05 MED ORDER — BLOOD GLUCOSE MONITOR KIT: PACK | Status: AC

## 2015-03-05 MED ORDER — GLYBURIDE 5 MG PO TABS: 5.0000 mg | ORAL_TABLET | Freq: Two times a day (BID) | ORAL | Status: AC

## 2015-03-05 NOTE — Progress Notes (Signed)
Inpatient Diabetes Program Recommendations  AACE/ADA: New Consensus Statement on Inpatient Glycemic Control (2015)  Target Ranges:  Prepandial:   less than 140 mg/dL      Peak postprandial:   less than 180 mg/dL (1-2 hours)      Critically ill patients:  140 - 180 mg/dL  Results for [facility] (MRN [id number]) as of 03/05/2015 09:11  Ref. Range 03/04/2015 05:21 03/04/2015 08:32 03/04/2015 11:35 03/04/2015 16:16 03/04/2015 22:40  Glucose-Capillary Latest Ref Range: 65-99 mg/dL 409 (H) 811 (H) 914 (H) 312 (H) 209 (H)  Results for [facility] (MRN [id number]) as of 03/05/2015 09:11  Ref. Range 03/04/2015 01:01  Hemoglobin A1C Latest Ref Range: 4.8-5.6 % 10.7 (H)   Review of Glycemic Control Current orders for Inpatient glycemic control: Glyburide 5 mg QAM, Novolog 0-15 units TID with meals, Novolog 3 units TID with meals  Inpatient Diabetes Program Recommendations: Oral Agents: Please increase Glyburide to 5 mg BID. Patient will need to follow up with PCP regarding further changes with DM medication for diabetes control. Please ask patient to check his glucose at least 2-3 times per day and keep a log of glucose values he will need to take with him to his follow up visit with PCP.  Thanks, Orlando Penner, RN, MSN, CCRN, CDE Diabetes Coordinator Inpatient Diabetes Program [phone] (Team Pager from 8am to 5pm) [phone] ([place] office) [phone] [facility] office) [phone] [facility] office)

## 2015-03-05 NOTE — Progress Notes (Signed)
CARDIAC REHAB PHASE I   PRE:  Rate/Rhythm: 92 SR  BP:  Sitting: 142/94        SaO2: 98 RA  MODE:  Ambulation: 1050 ft   POST:  Rate/Rhythm: 100 SR  BP:  Sitting: 136/90         SaO2: 99 RA  Pt ambulated 1050 ft on RA, independent, steady gait, tolerated well.  Pt denies DOE, dizziness, declined rest stop. Pt did c/o 2/10 tightness after ambulation, relieved with rest. Completed MI/stent education with pt wife and mother at bedside.  Reviewed anti-platelet therapy, activity restrictions, ntg, exercise, heart healthy diet, carb counting, portion control, daily weights, sodium restrictions, heart failure book and phase 2 cardiac rehab. Pt and wife verbalized understanding, completed teach back. Pt hopeful for discharge today. Pt to bed after walk, call bell within reach.  [phone] Joylene Grapes, RN, BSN 03/05/2015 12:05 PM

## 2015-03-05 NOTE — Progress Notes (Signed)
Pt d/c per MD. VSS NAD Father of pt at bedside for d/c teaching. Cardiac Rehab did all medication teaching earlier today with family at bedside. Verbalization of understanding from pt. Pt aware of need to call for f/u appt this week. Pt escorted by RN via WC.

## 2015-03-05 NOTE — Discharge Instructions (Signed)
Acute Coronary Syndrome °Acute coronary syndrome (ACS) is an urgent problem in which the blood and oxygen supply to the heart is critically deficient. ACS requires hospitalization because one or more coronary arteries may be blocked. °ACS represents a range of conditions including: °· Previous angina that is now unstable, lasts longer, happens at rest, or is more intense. °· A heart attack, with heart muscle cell injury and death. °There are three vital coronary arteries that supply the heart muscle with blood and oxygen so that it can pump blood effectively. If blockages to these arteries develop, blood flow to the heart muscle is reduced. If the heart does not get enough blood, angina may occur as the first warning sign. °SYMPTOMS  °· The most common signs of angina include: °¨ Tightness or squeezing in the chest. °¨ Feeling of heaviness on the chest. °¨ Discomfort in the arms, neck, back, or jaw. °¨ Shortness of breath and nausea. °¨ Cold, wet skin. °· Angina is usually brought on by physical effort or excitement which increase the oxygen needs of the heart. These states increase the blood flow needs of the heart beyond what can be delivered. °· Other symptoms that are not as common include: °¨ Fatigue °¨ Unexplained feelings of nervousness or anxiety °¨ Weakness °¨ Diarrhea °· Sometimes, you may not have noticed any symptoms at all but still suffered a cardiac injury. °TREATMENT  °· Medicines to help discomfort may include nitroglycerin (nitro) in the form of tablets or a spray for rapid relief, or longer-acting forms such as cream, patches, or capsules. (Be aware that there are many side effects and possible interactions with other drugs). °· Other medicines may be used to help the heart pump better. °· Procedures to open blocked arteries including angioplasty or stent placement to keep the arteries open. °· Open heart surgery may be needed when there are many blockages or they are in critical locations that  are best treated with surgery. °HOME CARE INSTRUCTIONS  °· Do not use any tobacco products including cigarettes, chewing tobacco, or electronic cigarettes. °· Take one baby or adult aspirin daily, if your health care provider advises. This helps reduce the risk of a heart attack. °· It is very important that you follow the angina treatment prescribed by your health care provider. Make arrangements for proper follow-up care. °· Eat a heart healthy diet with salt and fat restrictions as advised. °· Regular exercise is good for you as long as it does not cause discomfort. Do not begin any new type of exercise until you check with your health care provider. °· If you are overweight, you should lose weight. °· Try to maintain normal blood lipid levels. °· Keep your blood pressure under control as recommended by your health care provider. °· You should tell your health care provider right away about any increase in the severity or frequency of your chest discomfort or angina attacks. When you have angina, you should stop what you are doing and sit down. This may bring relief in 3 to 5 minutes. If your health care provider has prescribed nitro, take it as directed. °· If your health care provider has given you a follow-up appointment, it is very important to keep that appointment. Not keeping the appointment could result in a chronic or permanent injury, pain, and disability. If there is any problem keeping the appointment, you must call back to this facility for assistance. °SEEK IMMEDIATE MEDICAL CARE IF:  °· You develop nausea, vomiting, or shortness   of breath.  You feel faint, lightheaded, or pass out.  Your chest discomfort gets worse.  You are sweating or experience sudden profound fatigue.  You do not get relief of your chest pain after 3 doses of nitro.  Your discomfort lasts longer than 15 minutes. MAKE SURE YOU:   Understand these instructions.  Will watch your condition.  Will get help right  away if you are not doing well or get worse.  Take all medicines as directed by your health care provider. Document Released: 06/01/2005 Document Revised: 06/06/2013 Document Reviewed: 10/03/2013 [facility] Patient Information 2015 [place], [place]. This information is not intended to replace advice given to you by your health care provider. Make sure you discuss any questions you have with your health care provider.  Coronary Angiogram With Stent, Care After Refer to this sheet in the next few weeks. These instructions provide you with information on caring for yourself after your procedure. Your health care provider may also give you more specific instructions. Your treatment has been planned according to current medical practices, but problems sometimes occur. Call your health care provider if you have any problems or questions after your procedure.  WHAT TO EXPECT AFTER THE PROCEDURE  The insertion site may be tender for a few days after your procedure. HOME CARE INSTRUCTIONS   Take medicines only as directed by your health care provider. Blood thinners may be prescribed after your procedure to improve blood flow through the stent.  Change any bandages (dressings) as directed by your health care provider.   Check your insertion site every day for redness, swelling, or fluid leaking from the insertion.   Do not take baths, swim, or use a hot tub until your health care provider approves. You may shower. Pat the insertion area dry. Do not rub the insertion area with a washcloth or towel.   Eat a heart-healthy diet. This should include plenty of fresh fruits and vegetables. Meat should be lean cuts. Avoid the following types of food:   Food that is high in salt.   Canned or highly processed food.   Food that is high in saturated fat or sugar.   Fried food.   Make any other lifestyle changes recommended by your health care provider. This may include:   Not using any tobacco  products including cigarettes, chewing tobacco, or electronic cigarettes.  Managing your weight.   Getting regular exercise.   Managing your blood pressure.   Limiting your alcohol intake.   Managing other health problems, such as diabetes.   If you need an MRI after your heart stent was placed, be sure to tell the health care provider who orders the MRI that you have a heart stent.   Keep all follow-up visits as directed by your health care provider.  SEEK IMMEDIATE MEDICAL CARE IF:   You develop chest pain, shortness of breath, feel faint, or pass out.  You have bleeding, swelling larger than a walnut, or drainage from the catheter insertion site.  You develop pain, discoloration, coldness, or severe bruising in the leg or arm that held the catheter.  You develop bleeding from any other place such as from the bowels. There may be bright red blood in the urine or stools, or it may appear as black, tarry stools.  You have a fever or chills. MAKE SURE YOU:  Understand these instructions.  Will watch your condition.  Will get help right away if you are not doing well or get worse. Document  Released: 12/19/2004 Document Revised: 10/16/2013 Document Reviewed: 11/02/2012 [facility] Patient Information 2015 [place], [place]. This information is not intended to replace advice given to you by your health care provider. Make sure you discuss any questions you have with your health care provider.

## 2015-03-05 NOTE — Discharge Summary (Addendum)
Physician Discharge Summary  Patient ID: Russell Mcneil MRN: [id number] DOB/AGE: [birth year] 45 y.o.  Admit date: 03/03/2015 Discharge date: 03/05/2015  Primary Discharge Diagnosis: Acute anterolateral myocardial infarction, successful angioplasty to the mid LAD with implantation of a 2.5 x 22 mm resolute integrity DES, diffusely diseased LAD and diagonal branches.  Secondary Discharge Diagnosis: 1. Diabetes mellitus type 2 uncontrolled 2. Hyperlipidemia  Significant Diagnostic Studies: Coronary angiogram 03/04/2015: Mid LAD high-grade 99% stenosis, reduced to 0%, status post PCI with 2.5 x 22 mm resolute DES. Diffusely diseased LAD and diagonal disease. Mild disease in the circumflex which are very large and right coronary artery. LVEF 35% with mid to distal anterolateral and apical akinesis.  Echocardiogram 03/04/2015: Left ventricle: The cavity size was normal. Systolic function wasmoderately to severely reduced. The estimated ejection fractionwas in the range of 30% to 35%. Akinesis of the apical anterior, apical anterolateral, apical inferior, and peri-apicalmyocardium. Left ventricular diastolic function parameters werenormal. Atrial septum: No defect or patent foramen ovale was identified.  Hospital Course: Russell Mcneil is a 44 y.o. Asian male with DM who had not seen an MD in more than a year.  He presented to the ED on 03/03/15 with chest pain on and off for 4-6 hours, pain started around 4:00 PM with radiation to the left arm, associated with feeling warmth throughout the body. Denied any nausea, vomiting, diaphoresis, dizziness or syncope.  EKG revealed STEMI and he was taken emergently to the cath lab.  Coronary angiogram revealed Diffusely diseased LAD and diagonal disease. Mild disease in the circumflex which are very large and right coronary artery. LVEF 35% with mid to distal anterolateral and apical akinesis. He underwent successful PTCA and stenting of the mid LAD.  He's had  extensive evaluation and education by diabetes coordinator in regards to uncontrolled diabetes and self management.    Recommendations on discharge: He has been started on high dose, high intensity statin, BB, ACE-I, DAPT with ASA and Brilinta, and Glyburide and Metformin.  I've increased Glyburide from qd to bid per recommendation of Diabetes Coordinator.  Continue all other medications.  He states he has been unable to tolerate Metformin in the past, although difficult to elicit specific adverse response, we can f/u on these symptoms as we continue to f/u in the office.  Will need to schedule follow up with PCP in regards to continued follow up and management of diabetes.  Follow up outpatient in 7-10 days for reevaluation.    Discharge Exam: Blood pressure 123/89, pulse 86, temperature 98.1 F (36.7 C), temperature source Oral, resp. rate 19, height 5' 3"  (1.6 m), weight 64.411 kg (142 lb), SpO2 97 %.    General appearance: alert, cooperative, appears stated age and no distress Eyes: negative findings: lids and lashes normal Neck: no adenopathy, no carotid bruit, no JVD, supple, symmetrical, trachea midline and thyroid not enlarged, symmetric, no tenderness/mass/nodules Resp: clear to auscultation bilaterally Chest wall: no tenderness Cardio: regular rate and rhythm, S1, S2 normal, no murmur, click, rub or gallop GI: soft, non-tender; bowel sounds normal; no masses, no organomegaly Extremities: extremities normal, atraumatic, no cyanosis or edema Pulses: 2+ and symmetric Skin: Skin color, texture, turgor normal. No rashes or lesions or Skin is cool and mildly damp. Neurologic: Grossly normal  Labs:   Lab Results  Component Value Date   WBC 12.8* 03/04/2015   HGB 15.0 03/04/2015   HCT 44.0 03/04/2015   MCV 78.9 03/04/2015   PLT 190 03/04/2015    Recent Labs Lab  03/04/15 0101  NA 135  K 3.7  CL 100*  CO2 26  BUN 18  CREATININE 1.14  CALCIUM 9.2  GLUCOSE 394*    Lipid  Panel     Component Value Date/Time   CHOL 240* 03/04/2015 0101   TRIG 44 03/04/2015 0101   HDL 49 03/04/2015 0101   CHOLHDL 4.9 03/04/2015 0101   VLDL 9 03/04/2015 0101   LDLCALC 182* 03/04/2015 0101    HEMOGLOBIN A1C Lab Results  Component Value Date   HGBA1C 10.7* 03/04/2015   MPG 260 03/04/2015    Cardiac Panel (last 3 results)  Recent Labs  03/04/15 0101 03/04/15 0850 03/04/15 1345  TROPONINI 5.27* 60.03* 35.88*    Lab Results  Component Value Date   TROPONINI 35.88* 03/04/2015     TSH  Recent Labs  03/04/15 0101  TSH 0.719     EKG 03/04/2015 at 2228:  Initial presentation EKG essentially reveals normal sinus rhythm with early repolarization with nonspecific inferior ST abnormality. Does not meet criteria for ST elevation myocardial infarction.  Repeat EKG 2245: ST segment elevation in lead 1, aVL and V2, slight prominent ST elevation in V5 and V6, suggestive of acute injury pattern.  EKG 03/04/2015: normal sinus rhythm with anterolateral infarct old.  EKG 03/05/2015: NSR at a rate of 75 bpm, normal axis, anterolateral infarct old.    Radiology: Dg Chest Portable 1 View  03/03/2015   CLINICAL DATA:  Chest pain.  EXAM: PORTABLE CHEST - 1 VIEW  COMPARISON:  None.  FINDINGS: Shallow inspiration. Normal heart size and pulmonary vascularity. Mediastinal contours appear intact. No focal airspace disease or consolidation in the lungs. No blunting of costophrenic angles.  IMPRESSION: No active disease.   Electronically Signed   By: Lucienne Capers M.D.   On: 03/03/2015 23:52      FOLLOW UP PLANS AND APPOINTMENTS Discharge Instructions    AMB referral to cardiac rehabilitation    Complete by:  As directed   Congestive Heart Failure: If diagnosis is Heart Failure, patient MUST meet each of the CMS criteria: 1. Left Ventricular Ejection Fraction </= 35% 2. NYHA class II-IV symptoms despite being on optimal heart failure therapy for at least 6 weeks. 3. Stable =  have not had a recent (<6 weeks) or planned (<6 months) major cardiovascular hospitalization or procedure  Program Details: - Physician supervised classes - 1-3 classes per week over a 12-18 week period, generally for a total of 36 sessions  Physician Certification: I certify that the above Cardiac Rehabilitation treatment is medically necessary and is medically approved by me for treatment of this patient. The patient is willing and cooperative, able to ambulate and medically stable to participate in exercise rehabilitation. The participant's progress and Individualized Treatment Plan will be reviewed by the Medical Director, Cardiac Rehab staff and as indicated by the Referring/Ordering Physician.  Diagnosis:   Myocardial Infarction Comment - Anterior MI 03/04/2015 PCI       Amb Referral to Cardiac Rehabilitation    Complete by:  As directed   Congestive Heart Failure: If diagnosis is Heart Failure, patient MUST meet each of the CMS criteria: 1. Left Ventricular Ejection Fraction </= 35% 2. NYHA class II-IV symptoms despite being on optimal heart failure therapy for at least 6 weeks. 3. Stable = have not had a recent (<6 weeks) or planned (<6 months) major cardiovascular hospitalization or procedure  Program Details: - Physician supervised classes - 1-3 classes per week over a 12-18 week period, generally  for a total of 36 sessions  Physician Certification: I certify that the above Cardiac Rehabilitation treatment is medically necessary and is medically approved by me for treatment of this patient. The patient is willing and cooperative, able to ambulate and medically stable to participate in exercise rehabilitation. The participant's progress and Individualized Treatment Plan will be reviewed by the Medical Director, Cardiac Rehab staff and as indicated by the Referring/Ordering Physician.  Diagnosis:   Myocardial Infarction PCI              Medication List    TAKE these  medications        aspirin 81 MG chewable tablet  Chew 1 tablet (81 mg total) by mouth daily.     atorvastatin 80 MG tablet  Commonly known as:  LIPITOR  Take 1 tablet (80 mg total) by mouth daily at 6 PM.     blood glucose meter kit and supplies Kit  Dispense based on patient and insurance preference. Use up to four times daily as directed. (FOR ICD-9 250.00, 250.01).     carvedilol 6.25 MG tablet  Commonly known as:  COREG  Take 1 tablet (6.25 mg total) by mouth 2 (two) times daily with a meal.     glyBURIDE 5 MG tablet  Commonly known as:  DIABETA  Take 1 tablet (5 mg total) by mouth 2 (two) times daily with a meal.     lisinopril 5 MG tablet  Commonly known as:  PRINIVIL,ZESTRIL  Take 1 tablet (5 mg total) by mouth daily.     metFORMIN 500 MG tablet  Commonly known as:  GLUCOPHAGE  Take 1 tablet (500 mg total) by mouth 2 (two) times daily with a meal.     nitroGLYCERIN 0.4 MG SL tablet  Commonly known as:  NITROSTAT  Place 1 tablet (0.4 mg total) under the tongue every 5 (five) minutes x 3 doses as needed for chest pain.     ticagrelor 90 MG Tabs tablet  Commonly known as:  BRILINTA  Take 1 tablet (90 mg total) by mouth 2 (two) times daily.           Follow-up Information    Follow up with Adrian Prows, MD In 1 week.   Specialty:  Cardiology   Why:  Call office for appt   Contact information:   [address] [place] [address] [place], NP-C 03/05/2015, 9:58 AM [facility] Pager: [phone] Office: [phone]  I have personally reviewed the patient's record and performed physical exam and agree with the assessment and plan of Ms. Neldon Labella, NP-C.  Adrian Prows, MD 03/05/2015, 11:28 AM [facility] [place] Pager: [phone] Office: [phone] If no answer: Cell:  [phone]

## 2015-03-12 LAB — GLUCOSE, CAPILLARY: Glucose-Capillary: 174 mg/dL — ABNORMAL HIGH (ref 65–99)

## 2015-09-23 DIAGNOSIS — E1165 Type 2 diabetes mellitus with hyperglycemia: Secondary | ICD-10-CM | POA: Diagnosis not present

## 2016-01-20 DIAGNOSIS — E7800 Pure hypercholesterolemia, unspecified: Secondary | ICD-10-CM | POA: Diagnosis not present

## 2016-01-20 DIAGNOSIS — I2510 Atherosclerotic heart disease of native coronary artery without angina pectoris: Secondary | ICD-10-CM | POA: Diagnosis not present

## 2016-01-20 DIAGNOSIS — E1165 Type 2 diabetes mellitus with hyperglycemia: Secondary | ICD-10-CM | POA: Diagnosis not present

## 2016-01-20 DIAGNOSIS — E118 Type 2 diabetes mellitus with unspecified complications: Secondary | ICD-10-CM | POA: Diagnosis not present

## 2016-03-24 DIAGNOSIS — E119 Type 2 diabetes mellitus without complications: Secondary | ICD-10-CM | POA: Diagnosis not present

## 2016-03-24 DIAGNOSIS — Z7984 Long term (current) use of oral hypoglycemic drugs: Secondary | ICD-10-CM | POA: Diagnosis not present

## 2016-08-10 DIAGNOSIS — J111 Influenza due to unidentified influenza virus with other respiratory manifestations: Secondary | ICD-10-CM | POA: Diagnosis not present

## 2016-08-11 DIAGNOSIS — J111 Influenza due to unidentified influenza virus with other respiratory manifestations: Secondary | ICD-10-CM | POA: Diagnosis not present

## 2016-09-28 DIAGNOSIS — E7800 Pure hypercholesterolemia, unspecified: Secondary | ICD-10-CM | POA: Diagnosis not present

## 2016-09-28 DIAGNOSIS — E119 Type 2 diabetes mellitus without complications: Secondary | ICD-10-CM | POA: Diagnosis not present

## 2016-09-28 DIAGNOSIS — I2510 Atherosclerotic heart disease of native coronary artery without angina pectoris: Secondary | ICD-10-CM | POA: Diagnosis not present

## 2017-01-08 DIAGNOSIS — R0789 Other chest pain: Secondary | ICD-10-CM | POA: Diagnosis not present

## 2017-01-08 DIAGNOSIS — E7800 Pure hypercholesterolemia, unspecified: Secondary | ICD-10-CM | POA: Diagnosis not present

## 2017-01-08 DIAGNOSIS — E1165 Type 2 diabetes mellitus with hyperglycemia: Secondary | ICD-10-CM | POA: Diagnosis not present

## 2017-01-08 DIAGNOSIS — I2510 Atherosclerotic heart disease of native coronary artery without angina pectoris: Secondary | ICD-10-CM | POA: Diagnosis not present

## 2017-01-08 DIAGNOSIS — E118 Type 2 diabetes mellitus with unspecified complications: Secondary | ICD-10-CM | POA: Diagnosis not present

## 2017-02-23 DIAGNOSIS — E118 Type 2 diabetes mellitus with unspecified complications: Secondary | ICD-10-CM | POA: Diagnosis not present

## 2017-02-23 DIAGNOSIS — R079 Chest pain, unspecified: Secondary | ICD-10-CM | POA: Diagnosis not present

## 2017-02-23 DIAGNOSIS — I2510 Atherosclerotic heart disease of native coronary artery without angina pectoris: Secondary | ICD-10-CM | POA: Diagnosis not present

## 2017-02-23 DIAGNOSIS — E1165 Type 2 diabetes mellitus with hyperglycemia: Secondary | ICD-10-CM | POA: Diagnosis not present

## 2017-03-08 DIAGNOSIS — I2510 Atherosclerotic heart disease of native coronary artery without angina pectoris: Secondary | ICD-10-CM | POA: Diagnosis not present

## 2017-03-08 DIAGNOSIS — E119 Type 2 diabetes mellitus without complications: Secondary | ICD-10-CM | POA: Diagnosis not present

## 2017-03-08 DIAGNOSIS — R0789 Other chest pain: Secondary | ICD-10-CM | POA: Diagnosis not present

## 2017-03-16 DIAGNOSIS — I2510 Atherosclerotic heart disease of native coronary artery without angina pectoris: Secondary | ICD-10-CM | POA: Diagnosis not present

## 2017-03-16 DIAGNOSIS — E7800 Pure hypercholesterolemia, unspecified: Secondary | ICD-10-CM | POA: Diagnosis not present

## 2017-03-16 DIAGNOSIS — R079 Chest pain, unspecified: Secondary | ICD-10-CM | POA: Diagnosis not present

## 2017-04-05 DIAGNOSIS — E119 Type 2 diabetes mellitus without complications: Secondary | ICD-10-CM | POA: Diagnosis not present

## 2017-04-14 IMAGING — DX DG CHEST 1V PORT
1 series · 1 of 1 positions shown · non-contrast
Comparison: None.

CLINICAL DATA: Chest pain.

EXAM:
PORTABLE CHEST - 1 VIEW

[chest ap]
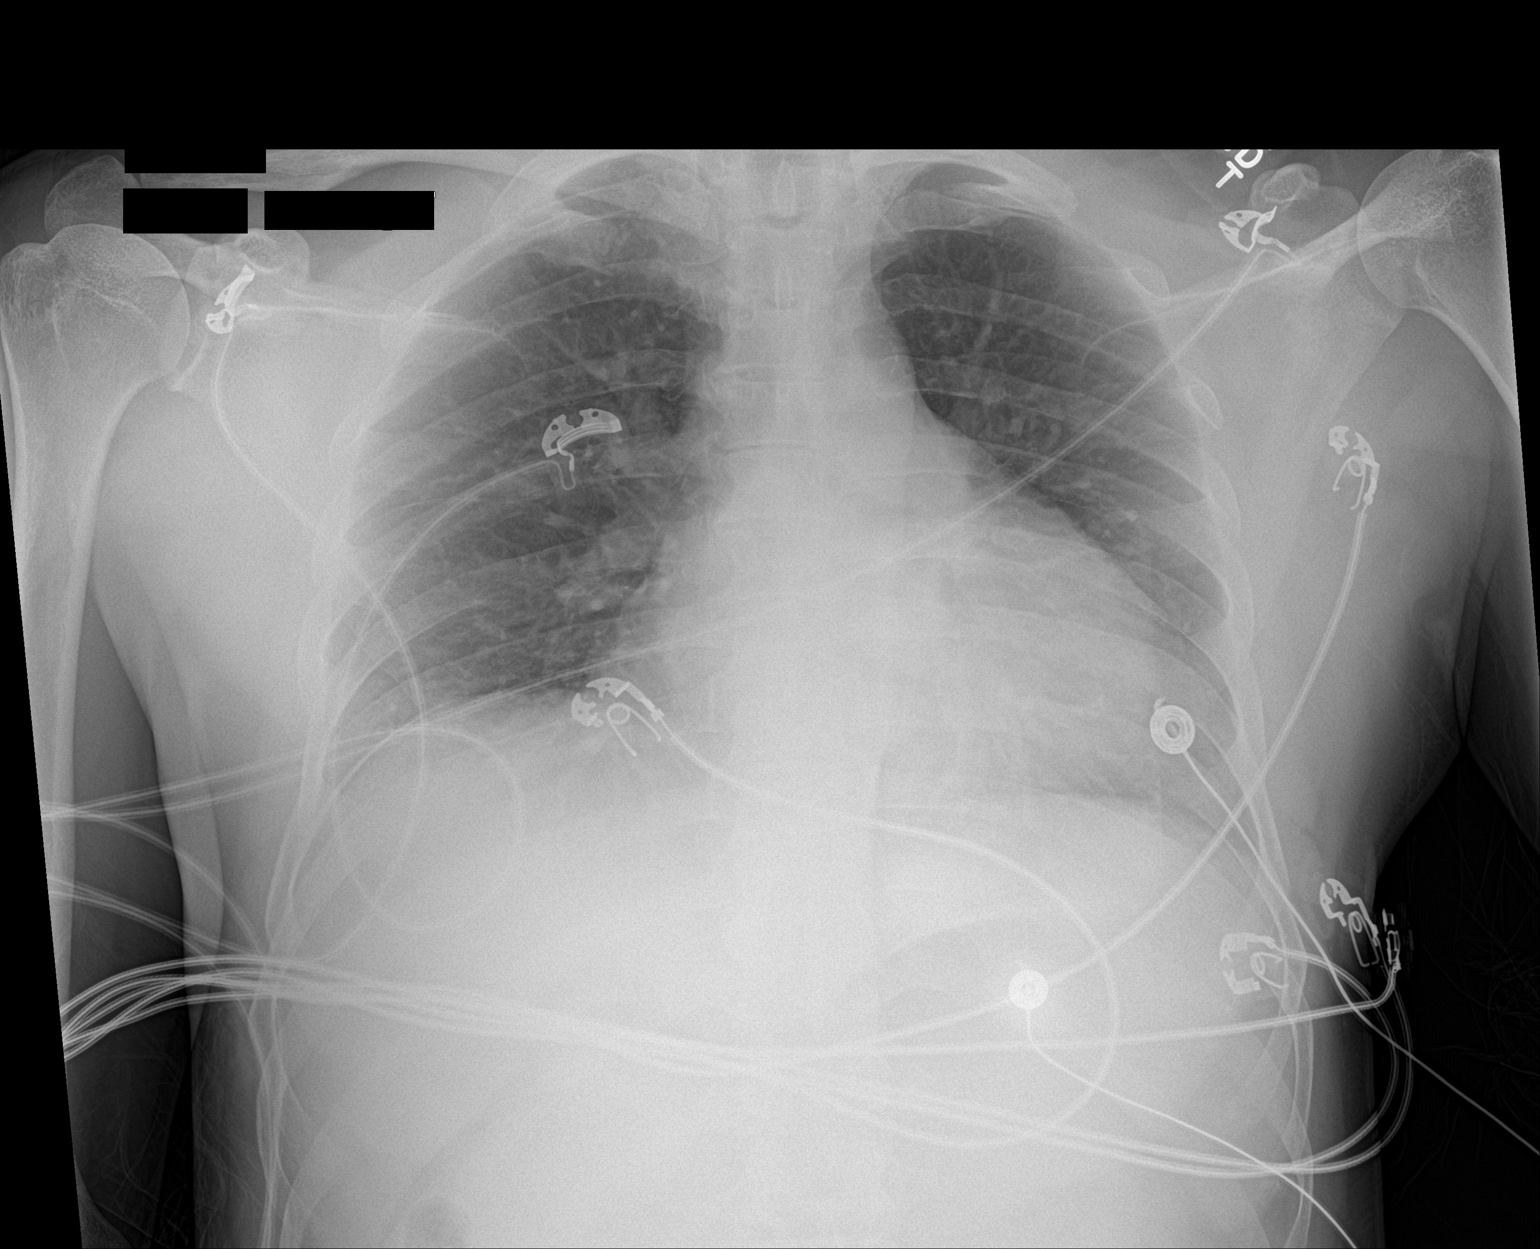

[1 of 1 positions shown; findings below may reference images not displayed]

FINDINGS: Shallow inspiration. Normal heart size and pulmonary vascularity.
Mediastinal contours appear intact. No focal airspace disease or
consolidation in the lungs. No blunting of costophrenic angles.
IMPRESSION: No active disease.

## 2017-08-16 DIAGNOSIS — R05 Cough: Secondary | ICD-10-CM | POA: Diagnosis not present

## 2017-08-16 DIAGNOSIS — J019 Acute sinusitis, unspecified: Secondary | ICD-10-CM | POA: Diagnosis not present

## 2018-04-04 DIAGNOSIS — E119 Type 2 diabetes mellitus without complications: Secondary | ICD-10-CM | POA: Diagnosis not present

## 2018-04-04 DIAGNOSIS — I2510 Atherosclerotic heart disease of native coronary artery without angina pectoris: Secondary | ICD-10-CM | POA: Diagnosis not present

## 2018-04-04 DIAGNOSIS — E7800 Pure hypercholesterolemia, unspecified: Secondary | ICD-10-CM | POA: Diagnosis not present

## 2018-04-04 DIAGNOSIS — Z23 Encounter for immunization: Secondary | ICD-10-CM | POA: Diagnosis not present

## 2018-08-26 DIAGNOSIS — J069 Acute upper respiratory infection, unspecified: Secondary | ICD-10-CM | POA: Diagnosis not present

## 2019-06-02 DIAGNOSIS — E119 Type 2 diabetes mellitus without complications: Secondary | ICD-10-CM | POA: Diagnosis not present

## 2023-04-23 ENCOUNTER — Ambulatory Visit (HOSPITAL_COMMUNITY)
Admission: EM | Admit: 2023-04-23 | Discharge: 2023-04-23 | Disposition: A | Discharge status: Home / Self Care | Payer: Managed Care, Other (non HMO) | Attending: Internal Medicine | Admitting: Internal Medicine

## 2023-04-23 ENCOUNTER — Encounter (HOSPITAL_COMMUNITY): Payer: Self-pay

## 2023-04-23 DIAGNOSIS — S39012A Strain of muscle, fascia and tendon of lower back, initial encounter: Secondary | ICD-10-CM

## 2023-04-23 MED ORDER — NAPROXEN 500 MG PO TABS: 500.0000 mg | ORAL_TABLET | Freq: Two times a day (BID) | ORAL | 0 refills | Status: AC

## 2023-04-23 NOTE — ED Provider Notes (Signed)
[facility]    CSN: [id number] Arrival date & time: 04/23/23  1643      History   Chief Complaint Chief Complaint  Patient presents with   Pain    HPI Russell Mcneil is a 52 y.o. male.   HPI Right sided low back pain onset 2 days ago, had mild discomfort during the day but pain worsened when he bent over in the shower to wash his feet.  His right lumbar at times radiated around to his abdomen, at worst 8 out of 10, no over-the-counter remedies tried, feels better today now 4 out of 10, pain worsens with certain movements such as twisting or bending.  Admits similar pain several years ago was seen in an emergency department had an x-ray was treated with prescription.  Denies recent fall.  Does a lot of twisting on his job. Denies radiation of pain to legs or groin, fever, chills, sweats, urinary symptoms, nausea, vomiting, diarrhea, constipation, rashes or skin changes, weakness or paresthesia, change in appetite.  Past medical history includes MI, high cholesterol, diabetes.  States he does not take his medications  Past Medical History:  Diagnosis Date   Diabetes mellitus without complication [facility])     Patient Active Problem List   Diagnosis Date Noted   Acute MI, anterior wall, initial episode of care (HCC) 03/04/2015   Hyperlipidemia 03/04/2015   Myocardial infarction, anterolateral, acute (HCC) 03/03/2015   Diabetes mellitus type 2, uncontrolled 03/03/2015    Past Surgical History:  Procedure Laterality Date   CARDIAC CATHETERIZATION N/A 03/03/2015   Procedure: Left Heart Cath and Coronary Angiography;  Surgeon: Yates Decamp, MD;  Location: [facility] INVASIVE CV LAB;  Service: Cardiovascular;  Laterality: N/A;   CARDIAC CATHETERIZATION N/A 03/03/2015   Procedure: Coronary Stent Intervention;  Surgeon: Yates Decamp, MD;  Location: [facility] INVASIVE CV LAB;  Service: Cardiovascular;  Laterality: N/A;       Home Medications    Prior to Admission medications   Medication Sig  Start Date End Date Taking? Authorizing Provider  glipiZIDE (GLUCOTROL XL) 10 MG 24 hr tablet Take 10 mg by mouth daily. 04/07/23  Yes [provider]  aspirin 81 MG chewable tablet Chew 1 tablet (81 mg total) by mouth daily. 03/05/15   Marcy Salvo, NP  atorvastatin (LIPITOR) 80 MG tablet Take 1 tablet (80 mg total) by mouth daily at 6 PM. 03/05/15   Marcy Salvo, NP  blood glucose meter kit and supplies KIT Dispense based on patient and insurance preference. Use up to four times daily as directed. (FOR ICD-9 250.00, 250.01). 03/05/15   Marcy Salvo, NP  carvedilol (COREG) 6.25 MG tablet Take 1 tablet (6.25 mg total) by mouth 2 (two) times daily with a meal. 03/05/15   Marcy Salvo, NP  glyBURIDE (DIABETA) 5 MG tablet Take 1 tablet (5 mg total) by mouth 2 (two) times daily with a meal. 03/05/15   Marcy Salvo, NP  lisinopril (PRINIVIL,ZESTRIL) 5 MG tablet Take 1 tablet (5 mg total) by mouth daily. 03/05/15   Marcy Salvo, NP  metFORMIN (GLUCOPHAGE) 500 MG tablet Take 1 tablet (500 mg total) by mouth 2 (two) times daily with a meal. 03/05/15   Yates Decamp, MD  nitroGLYCERIN (NITROSTAT) 0.4 MG SL tablet Place 1 tablet (0.4 mg total) under the tongue every 5 (five) minutes x 3 doses as needed for chest pain. 03/05/15   Marcy Salvo, NP  ticagrelor (BRILINTA) 90 MG TABS tablet Take 1 tablet (90 mg total) by mouth  2 (two) times daily. 03/05/15   Marcy Salvo, NP    Family History History reviewed. No pertinent family history.  Social History Social History   Tobacco Use   Smoking status: Never    Passive exposure: Never   Smokeless tobacco: Never  Vaping Use   Vaping status: Never Used  Substance Use Topics   Alcohol use: Never   Drug use: Never     Allergies   Patient has no known allergies.   Review of Systems Review of Systems  Constitutional:  Negative for appetite change and chills.  HENT:  Negative for ear pain and rhinorrhea.    Respiratory:  Negative for cough and shortness of breath.   Gastrointestinal:  Positive for abdominal pain. Negative for blood in stool, constipation, diarrhea, nausea and vomiting.  Genitourinary:  Negative for dysuria, frequency, hematuria and testicular pain.  Musculoskeletal:  Positive for back pain.  Skin:  Negative for rash.  Neurological:  Negative for weakness and numbness.     Physical Exam Triage Vital Signs ED Triage Vitals  Encounter Vitals Group     BP 04/23/23 1846 130/75     Systolic BP Percentile --      Diastolic BP Percentile --      Pulse Rate 04/23/23 1846 68     Resp 04/23/23 1846 18     Temp 04/23/23 1846 98.2 F (36.8 C)     Temp Source 04/23/23 1846 Oral     SpO2 04/23/23 1846 98 %     Weight 04/23/23 1845 141 lb 15.6 oz (64.4 kg)     Height 04/23/23 1845 5' (1.524 m)     Head Circumference --      Peak Flow --      Pain Score 04/23/23 1841 6     Pain Loc --      Pain Education --      Exclude from Growth Chart --    No data found.  Updated Vital Signs BP 130/75 (BP Location: Right Arm)   Pulse 68   Temp 98.2 F (36.8 C) (Oral)   Resp 18   Ht 5' (1.524 m)   Wt 141 lb 15.6 oz (64.4 kg)   SpO2 98%   BMI 27.73 kg/m   Visual Acuity Right Eye Distance:   Left Eye Distance:   Bilateral Distance:    Right Eye Near:   Left Eye Near:    Bilateral Near:     Physical Exam Vitals and nursing note reviewed.  Constitutional:      Appearance: He is not ill-appearing.  HENT:     Head: Normocephalic.     Right Ear: Tympanic membrane and ear canal normal.     Left Ear: Tympanic membrane and ear canal normal.  Eyes:     Conjunctiva/sclera: Conjunctivae normal.  Cardiovascular:     Rate and Rhythm: Normal rate and regular rhythm.  Pulmonary:     Effort: Pulmonary effort is normal.     Breath sounds: Normal breath sounds.  Abdominal:     General: Bowel sounds are normal. There is no distension.     Palpations: Abdomen is soft.      Tenderness: There is no abdominal tenderness. There is no right CVA tenderness, left CVA tenderness or guarding.  Musculoskeletal:     Cervical back: Neck supple.     Thoracic back: No bony tenderness.     Lumbar back: No bony tenderness. Negative right straight leg raise test and negative left straight leg  raise test.  Neurological:     Mental Status: He is alert.     Gait: Gait normal.     Deep Tendon Reflexes: Reflexes normal.      UC Treatments / Results  Labs (all labs ordered are listed, but only abnormal results are displayed) Labs Reviewed - No data to display  EKG   Radiology No results found.  Procedures Procedures (including critical care time)  Medications Ordered in UC Medications - No data to display  Initial Impression / Assessment and Plan / UC Course  I have reviewed the triage vital signs and the nursing notes.  Pertinent labs & imaging results that were available during my care of the patient were reviewed by me and considered in my medical decision making (see chart for details).     52 year old with low back pain suspect musculoskeletal strain, pain has improved without any treatment.  Will try course of NSAIDs.  He missed work today will provide a work note.  He does not work again until Monday recommend rest, moist heat application, gentle stretching.  Warning signs and follow-up reviewed  Final Clinical Impressions(s) / UC Diagnoses   Final diagnoses:  None   Discharge Instructions   None    ED Prescriptions   None    PDMP not reviewed this encounter.   Meliton Rattan, [place] 04/23/23 1921

## 2023-04-23 NOTE — ED Triage Notes (Signed)
"  I am having pain in my mid/left lower back radiating to my right lower abd area (around pant line)". This seem's to be after being in shower and cleaning/washing feet/lower legs. No radiation of pain to buttocks and down leg. No dysuria. No urinary concerns. Stools "normal". No fever. No nausea. No vomiting.
